# Patient Record
Sex: Female | Born: 1985 | Race: White | Hispanic: No | Marital: Married | Smoking: Never smoker
Health system: Southern US, Community
[De-identification: ages and names within clinical notes are randomized; demographics above are authoritative.]

## PROBLEM LIST (undated history)

## (undated) DIAGNOSIS — D689 Coagulation defect, unspecified: Secondary | ICD-10-CM

## (undated) HISTORY — DX: Coagulation defect, unspecified: D68.9

---

## 2014-12-28 ENCOUNTER — Telehealth: Payer: Self-pay | Admitting: Medical

## 2014-12-28 ENCOUNTER — Ambulatory Visit (INDEPENDENT_AMBULATORY_CARE_PROVIDER_SITE_OTHER): Payer: Managed Care, Other (non HMO) | Admitting: Medical

## 2014-12-28 ENCOUNTER — Ambulatory Visit (HOSPITAL_BASED_OUTPATIENT_CLINIC_OR_DEPARTMENT_OTHER)
Admission: RE | Admit: 2014-12-28 | Discharge: 2014-12-28 | Disposition: A | Discharge status: Home / Self Care | Payer: Managed Care, Other (non HMO) | Source: Ambulatory Visit | Attending: Medical | Admitting: Medical

## 2014-12-28 ENCOUNTER — Encounter: Payer: Self-pay | Admitting: Medical

## 2014-12-28 VITALS — BP 95/67 | HR 60 | Temp 97.9°F | Ht 61.2 in | Wt 111.2 lb

## 2014-12-28 DIAGNOSIS — R0789 Other chest pain: Secondary | ICD-10-CM | POA: Diagnosis present

## 2014-12-28 DIAGNOSIS — Z658 Other specified problems related to psychosocial circumstances: Secondary | ICD-10-CM | POA: Diagnosis not present

## 2014-12-28 DIAGNOSIS — R0600 Dyspnea, unspecified: Secondary | ICD-10-CM

## 2014-12-28 DIAGNOSIS — D6801 Von willebrand disease, type 1: Secondary | ICD-10-CM | POA: Insufficient documentation

## 2014-12-28 DIAGNOSIS — F439 Reaction to severe stress, unspecified: Secondary | ICD-10-CM | POA: Insufficient documentation

## 2014-12-28 DIAGNOSIS — R5383 Other fatigue: Secondary | ICD-10-CM | POA: Insufficient documentation

## 2014-12-28 DIAGNOSIS — D680 Von Willebrand's disease: Secondary | ICD-10-CM | POA: Insufficient documentation

## 2014-12-28 DIAGNOSIS — R0602 Shortness of breath: Secondary | ICD-10-CM | POA: Diagnosis not present

## 2014-12-28 DIAGNOSIS — R42 Dizziness and giddiness: Secondary | ICD-10-CM | POA: Diagnosis not present

## 2014-12-28 LAB — COMPREHENSIVE METABOLIC PANEL
ALBUMIN: 4.3 g/dL (ref 3.5–5.2)
ALK PHOS: 69 U/L (ref 39–117)
ALT: 11 U/L (ref 0–35)
AST: 18 U/L (ref 0–37)
BUN: 9 mg/dL (ref 6–23)
CALCIUM: 9.2 mg/dL (ref 8.4–10.5)
CHLORIDE: 104 meq/L (ref 96–112)
CO2: 27 meq/L (ref 19–32)
Creatinine, Ser: 0.6 mg/dL (ref 0.40–1.20)
GFR: 126.39 mL/min (ref >60.00)
Glucose, Bld: 92 mg/dL (ref 70–99)
Potassium: 4.1 mEq/L (ref 3.5–5.1)
SODIUM: 137 meq/L (ref 135–145)
Total Bilirubin: 0.7 mg/dL (ref 0.2–1.2)
Total Protein: 7.2 g/dL (ref 6.0–8.3)

## 2014-12-28 LAB — CBC WITH DIFFERENTIAL/PLATELET
BASOS PCT: 0.3 % (ref 0.0–3.0)
Basophils Absolute: 0 10*3/uL (ref 0.0–0.1)
Eosinophils Absolute: 0.2 10*3/uL (ref 0.0–0.7)
Eosinophils Relative: 3.7 % (ref 0.0–5.0)
HCT: 41.9 % (ref 36.0–46.0)
HEMOGLOBIN: 14.1 g/dL (ref 12.0–15.0)
LYMPHS PCT: 26.7 % (ref 12.0–46.0)
Lymphs Abs: 1.3 10*3/uL (ref 0.7–4.0)
MCHC: 33.8 g/dL (ref 30.0–36.0)
MCV: 90.2 fl (ref 78.0–100.0)
MONO ABS: 0.2 10*3/uL (ref 0.1–1.0)
Monocytes Relative: 4.6 % (ref 3.0–12.0)
NEUTROS PCT: 64.7 % (ref 43.0–77.0)
Neutro Abs: 3.3 10*3/uL (ref 1.4–7.7)
PLATELETS: 225 10*3/uL (ref 150.0–400.0)
RBC: 4.64 Mil/uL (ref 3.87–5.11)
RDW: 12.9 % (ref 11.5–15.5)
WBC: 5 10*3/uL (ref 4.0–10.5)

## 2014-12-28 LAB — TSH: TSH: 0.85 u[IU]/mL (ref 0.35–4.50)

## 2014-12-28 LAB — TROPONIN I: TNIDX: 0 ug/l (ref 0.00–0.06)

## 2014-12-28 MED ORDER — ALBUTEROL SULFATE HFA 108 (90 BASE) MCG/ACT IN AERS: 2.0000 | INHALATION_SPRAY | Freq: Four times a day (QID) | RESPIRATORY_TRACT | Status: AC | PRN

## 2014-12-28 NOTE — Assessment & Plan Note (Signed)
Mild recently. Get cbc, cmp and tsh.

## 2014-12-28 NOTE — Assessment & Plan Note (Signed)
Some admitted since moving here 3 month ago.

## 2014-12-28 NOTE — Telephone Encounter (Signed)
rx albuterol

## 2014-12-28 NOTE — Progress Notes (Addendum)
Subjective:    Patient ID: Jamie BeetsRiddhi Gertsch, female    DOB: [birth year], 29 y.o.   MRN: [id number]  HPI   I have reviewed pt PMH, PSH, FH, Social History and Surgical History.  Von Villabrand type I. Pt states she gets some bruising on occasion. Dx by hematologist. [place] in [place]. Pt told to follow up only as needed.  Pt then mentions remote history of left kidney issue 10 years ago. At that time she described gross hematuria. She saw urologist for that and had cystoscscopy was done. Pt not sure of the results. The bleeding did stop. Pt never followed up. She states was 29 yo at that time. No gross hematuria since.  Pt states here stating some chest pain. Chest pain going on for about one week. Pain will occur mostly at night when she lays down. States feel sob like suffocated sensation. Pt states in September she had episode of costochondritis. No wheezing. No leg pain. No hx of asthma. Pt is not on ocp/contraceptive. LMP- December 02, 2014. Was normal. Pt states came at expected time. When get pain lying supine at night will not resolve if she gets up.    Pt new to area for 3 months. Pt works [employer] for [employer], exercises 2-3 times a week, rare caffeinated beverage, Pt eats fruits vegatable, and vegetables, vegetarian. Married- no children.  Pt since moving here some stress. Little bit.      Review of Systems  Constitutional: Negative for fever, chills, diaphoresis, activity change and fatigue.  Respiratory: Negative for cough, chest tightness, shortness of breath and wheezing.        Mild shortness of breath sensation now. Mid lower chest pain on palpation.  Cardiovascular: Negative for chest pain, palpitations and leg swelling.  Gastrointestinal: Negative for nausea, vomiting and abdominal pain.  Musculoskeletal: Negative for neck pain and neck stiffness.  Neurological: Negative for dizziness, tremors, seizures, syncope, facial asymmetry, speech difficulty, weakness,  light-headedness, numbness and headaches.  Psychiatric/Behavioral: Negative for behavioral problems, confusion and agitation. The patient is not nervous/anxious.      Past Medical History  Diagnosis Date  . Clotting disorder     History   Social History  . Marital Status: Married    Spouse Name: N/A  . Number of Children: N/A  . Years of Education: N/A   Occupational History  . Not on file.   Social History Main Topics  . Smoking status: Never Smoker   . Smokeless tobacco: Never Used  . Alcohol Use: 0.0 oz/week    0 Standard drinks or equivalent per week  . Drug Use: Not on file  . Sexual Activity: Not on file   Other Topics Concern  . Not on file   Social History Narrative  . No narrative on file    No past surgical history on file.  Family History  Problem Relation Age of Onset  . Hyperlipidemia Father     No Known Allergies  No current outpatient prescriptions on file prior to visit.   No current facility-administered medications on file prior to visit.    BP 95/67 mmHg  Pulse 60  Temp(Src) 97.9 F (36.6 C) (Oral)  Ht 5' 1.2" (1.554 m)  Wt 111 lb 3.2 oz (50.44 kg)  BMI 20.89 kg/m2  SpO2 100%  LMP 12/02/2014        Objective:   Physical Exam  General Mental Status- Alert. General Appearance- Not in acute distress.   Skin General: Color- Normal Color.  Moisture- Normal Moisture.  Neck Carotid Arteries- Normal color. Moisture- Normal Moisture. No carotid bruits. No JVD.  Chest and Lung Exam Auscultation: Breath Sounds:-Normal. Cta.  Anterior chest wall- only direct pain on palpation lower sternum above xyphoid process  Cardiovascular Auscultation:Rythm- Regular, rate and rhyhm. Murmurs & Other Heart Sounds:Auscultation of the heart reveals- No Murmurs.  Abdomen Inspection:-Inspeection Normal. Palpation/Percussion:Note:No mass. Palpation and Percussion of the abdomen reveal- Non Tender, Non Distended + BS, no rebound or  guarding.    Neurologic Cranial Nerve exam:- CN III-XII intact(No nystagmus), symmetric smile. Strength:- 5/5 equal and symmetric strength both upper and lower extremities.      Assessment & Plan:  ekg reviewed day of visit. Sinus rhythm. No acute ischemia viewed on review. Does not  appear to show criteria for pericarditis.

## 2014-12-28 NOTE — Assessment & Plan Note (Signed)
Vs costochondritis. Will get troponin based on complaint. Advised only tylenol. No aspirin or nsaids.  Consider echo or cardiologist referral if pain worsens on lying supine.  If pain worsens or changes then ED evaluation.

## 2014-12-28 NOTE — Progress Notes (Signed)
Pre visit review using our clinic review tool, if applicable. No additional management support is needed unless otherwise documented below in the visit note. 

## 2014-12-28 NOTE — Assessment & Plan Note (Signed)
Hx of. No recent bruising. No bleeding.

## 2014-12-28 NOTE — Patient Instructions (Signed)
Dyspnea Some reported but no pedal edema. No homans sign. O2 100%. But due to complaint will get cxr.  Atypical chest pain Vs costochondritis. Will get troponin based on complaint. Advised only tylenol. No aspirin or nsaids.  Consider echo or cardiologist referral if pain worsens on lying supine.  If pain worsens or changes then ED evaluation.  Stress Some admitted since moving here 3 month ago.    Follow up in 7 days or as needed

## 2014-12-28 NOTE — Assessment & Plan Note (Signed)
Some reported but no pedal edema. No homans sign. O2 100%. But due to complaint will get cxr.

## 2014-12-29 ENCOUNTER — Telehealth: Payer: Self-pay | Admitting: Medical

## 2014-12-29 NOTE — Telephone Encounter (Signed)
Relation to pt: self  Call back number: [phone]   Reason for call:  Patient returning your call regarding chest x ray results

## 2015-01-25 NOTE — Telephone Encounter (Signed)
Patient would like to speak with office regarding x ray results

## 2015-01-26 ENCOUNTER — Telehealth: Payer: Self-pay | Admitting: Medical

## 2015-01-26 NOTE — Telephone Encounter (Signed)
Pt did call back. I notified her of the xray results. She never took the albuterol which I did prescribe her. Still feels occasional sob. So advsied her to try inhaler for next 2 days. Then can stop on Saturday am. Call me on Monday and give update if she thought inhaler helped.

## 2015-01-26 NOTE — Telephone Encounter (Signed)
Called pt back. Left message asking her to call me back on Friday. That day would be more convenient to talk directly with her as this is her request. Thursday my long day at work. Typically busy entire day. Would not expect to be able to speak directly with her on a Thursday. She could talk with nurse if she chooses.

## 2016-03-19 IMAGING — CR DG CHEST 2V
2 series · 2 of 2 positions shown · non-contrast
Comparison: None.

CLINICAL DATA: One week of mid chest pain associated with shortness
of breath and dizziness, history of atypical chest pain and von
Willebrand's disease.

EXAM:
CHEST  2 VIEW

[w chest pa]
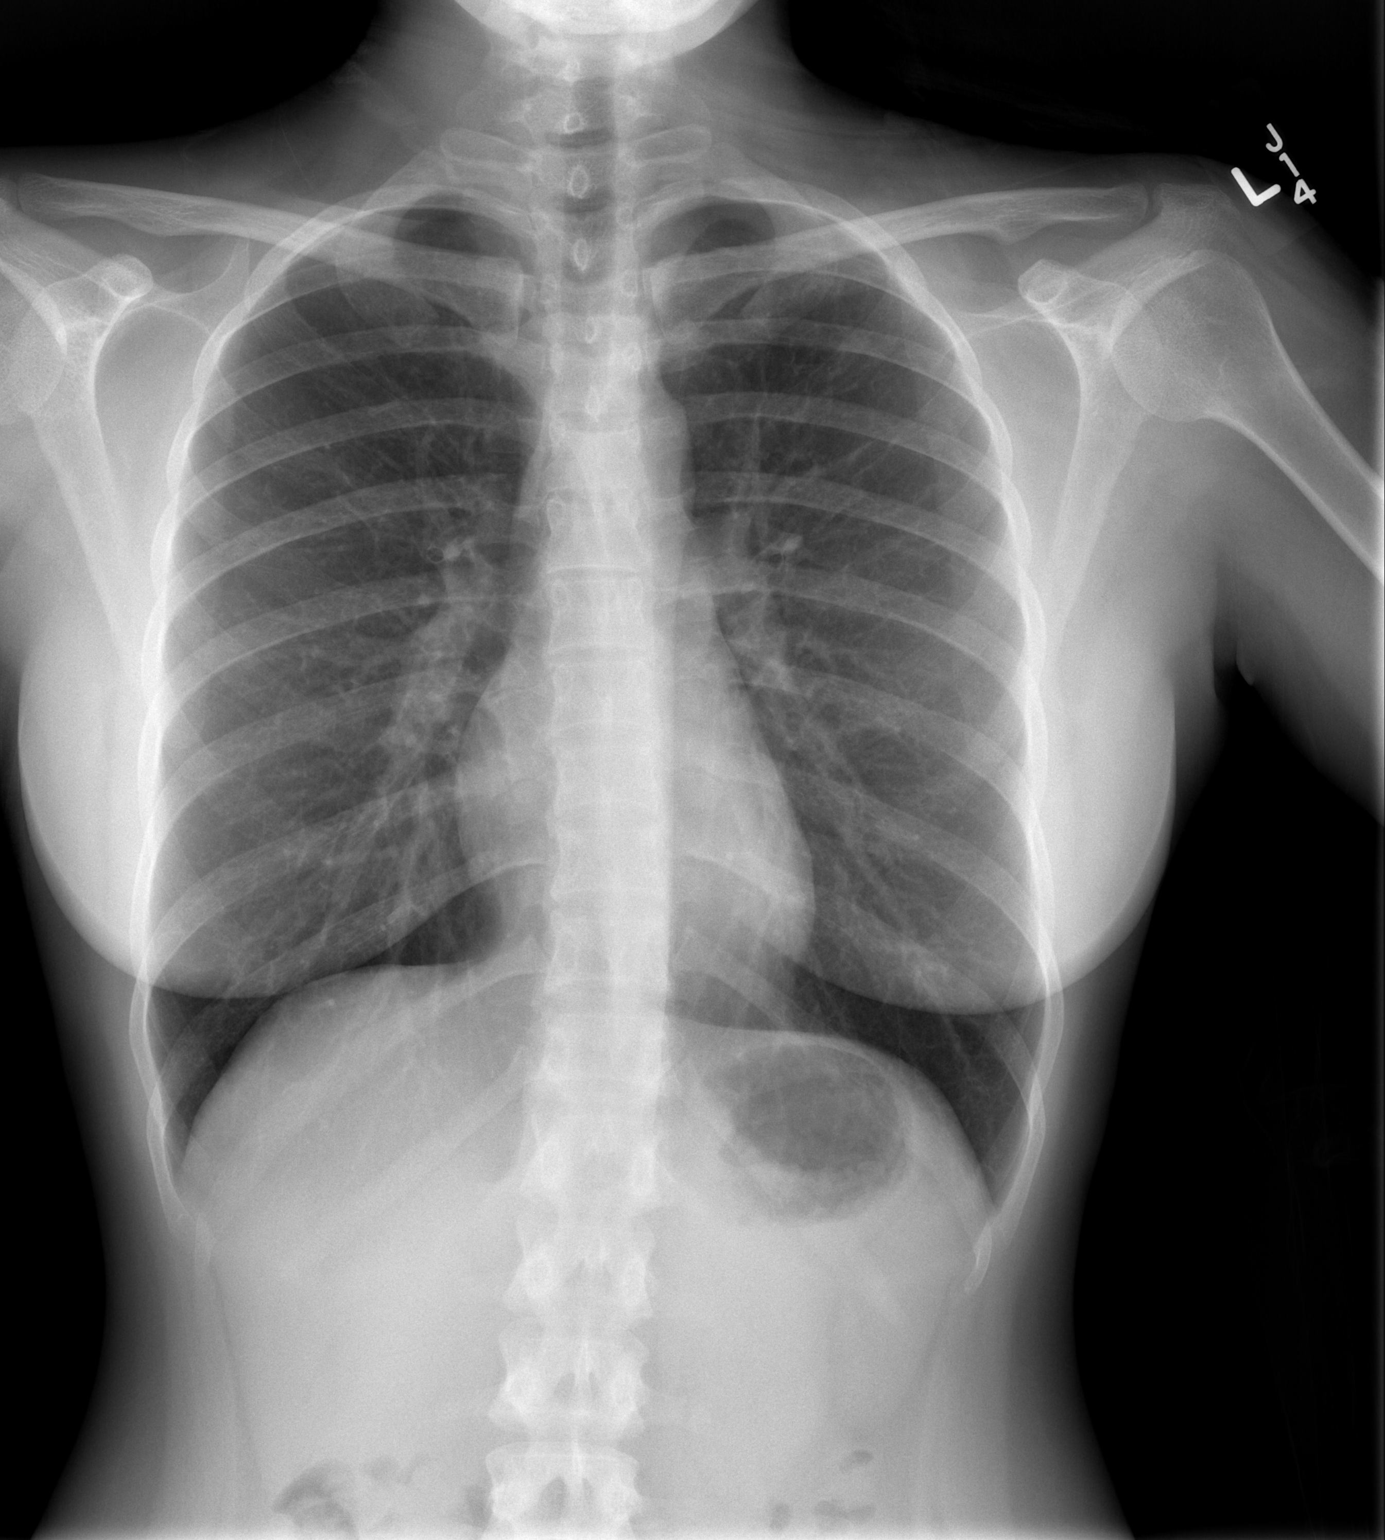

[w chest lat]
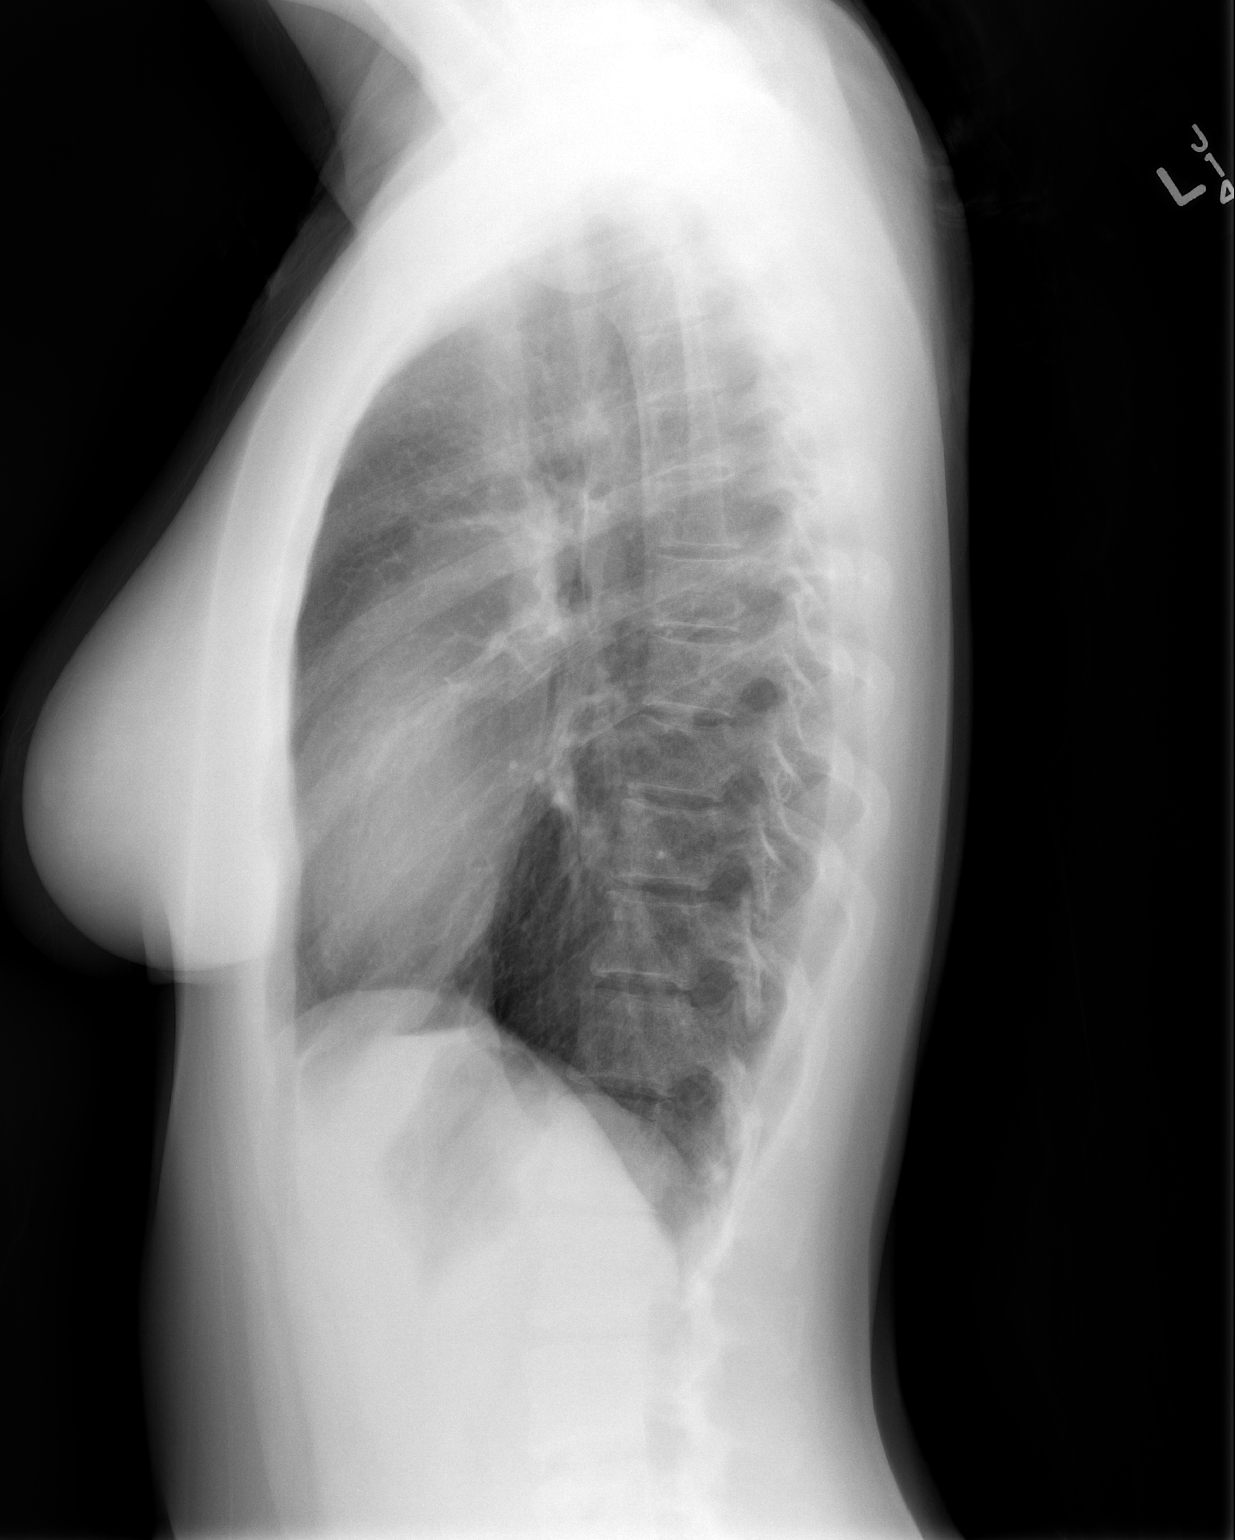

[2 of 2 positions shown; findings below may reference images not displayed]

FINDINGS: The lungs are mildly hyperinflated clear. The heart and pulmonary
vascularity are normal. The mediastinum is normal in width. There is
no pleural effusion. There is gentle levocurvature centered in the
lower thoracic spine.
IMPRESSION: There is no active cardiopulmonary disease. Mild hyperinflation may
be voluntary or could reflect underlying reactive airway disease.
